# Patient Record
Sex: Female | Born: 1960 | Race: White | Hispanic: No | Marital: Married
Health system: Southern US, Community
[De-identification: ages and names within clinical notes are randomized; demographics above are authoritative.]

## PROBLEM LIST (undated history)

## (undated) DIAGNOSIS — Z86018 Personal history of other benign neoplasm: Secondary | ICD-10-CM

## (undated) HISTORY — DX: Personal history of other benign neoplasm: Z86.018

---

## 2004-04-01 ENCOUNTER — Ambulatory Visit: Payer: Self-pay | Admitting: Unknown Physician Specialty

## 2005-06-24 ENCOUNTER — Ambulatory Visit: Payer: Self-pay | Admitting: Unknown Physician Specialty

## 2005-08-02 ENCOUNTER — Emergency Department: Payer: Self-pay | Admitting: Emergency Medicine

## 2006-07-07 ENCOUNTER — Ambulatory Visit: Payer: Self-pay | Admitting: Unknown Physician Specialty

## 2006-08-12 IMAGING — CR DG FOOT COMPLETE 3+V*L*
1 series · 3 of 3 positions shown · non-contrast
Comparison: none

REASON FOR EXAM: TWISTED LEFT FOOT YESTERDAY
COMMENTS:  LMP: Three weeks ago

[Series 1: view not recorded · 0.17mm/px · 3 of 3 slices shown]
[im 1/3]
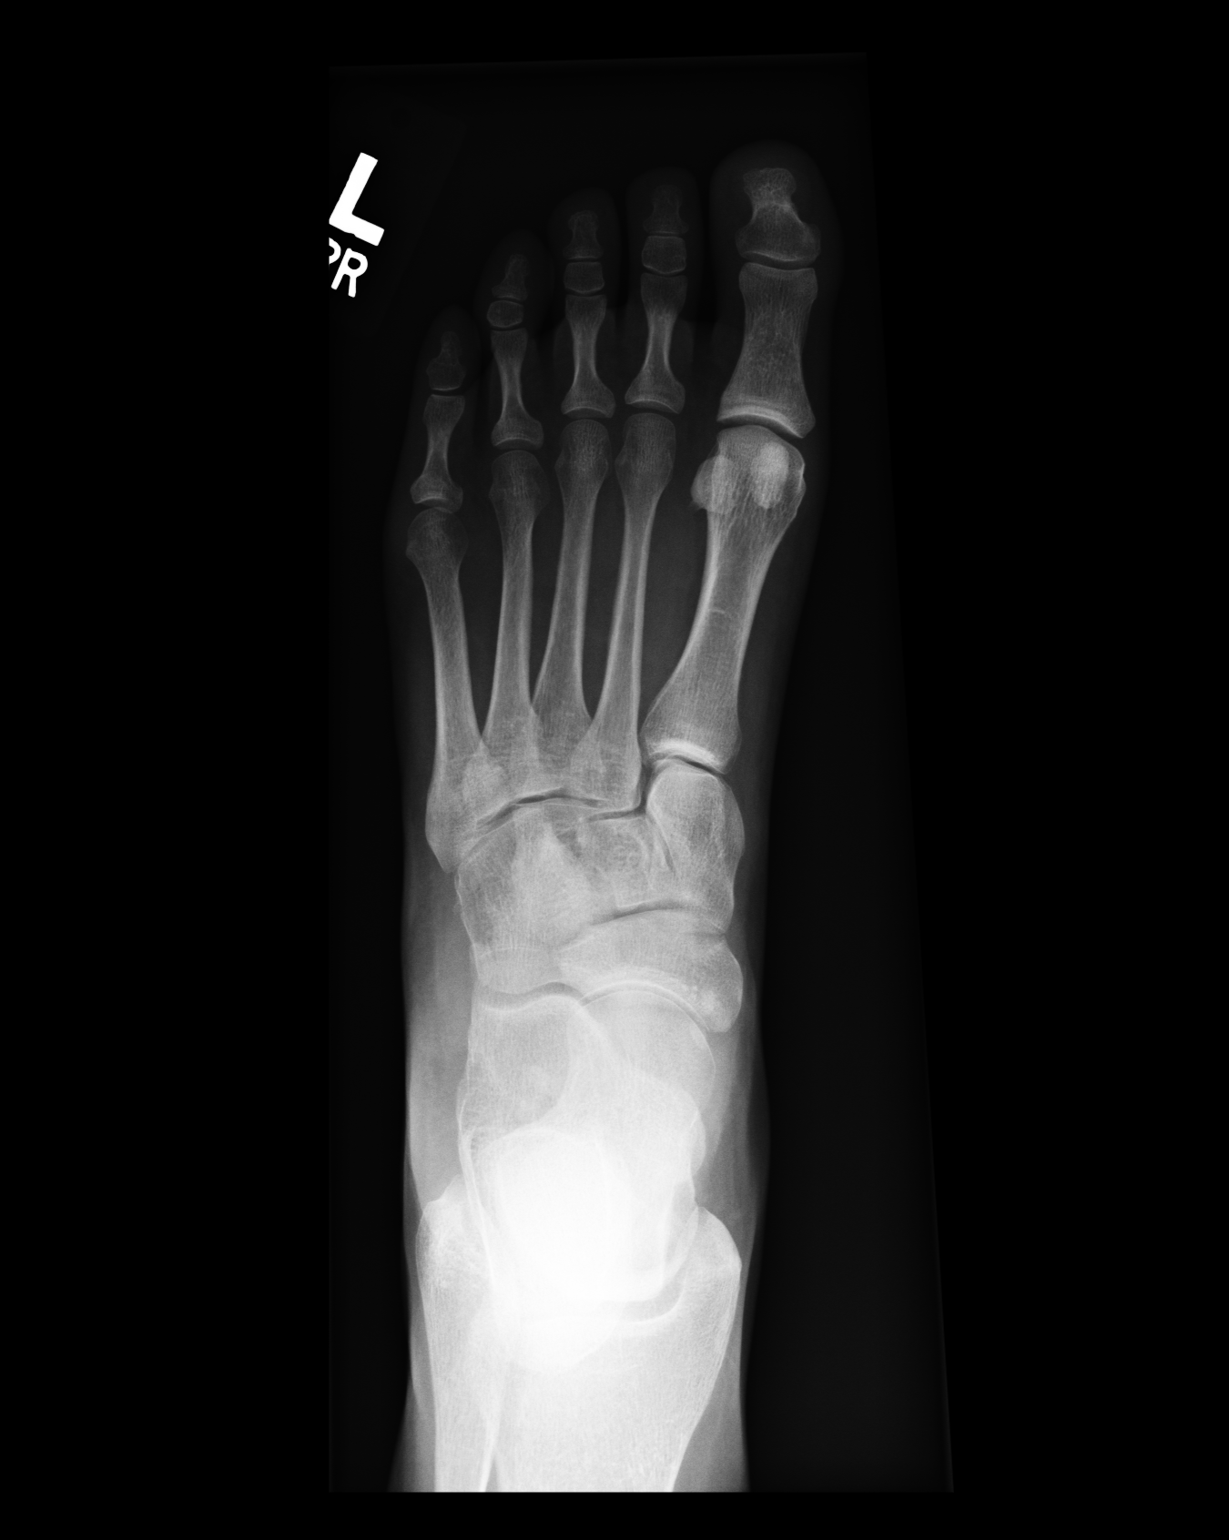
[im 2/3]
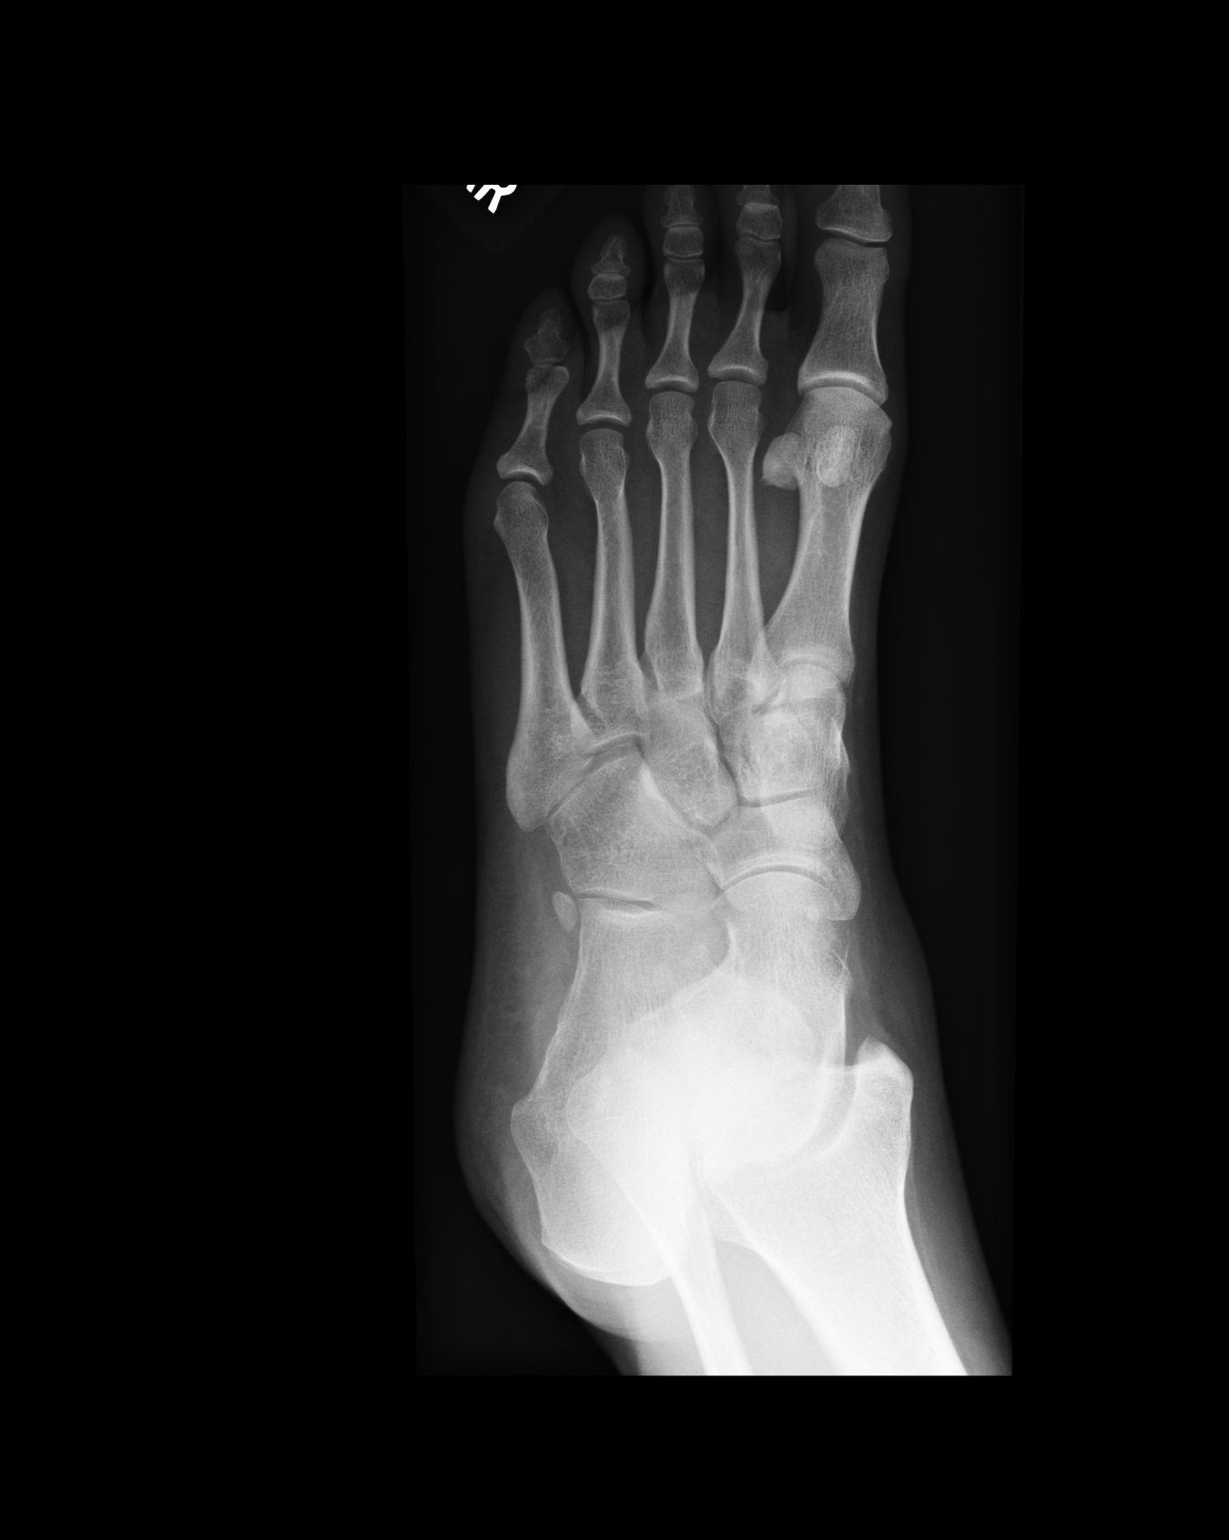
[im 3/3]
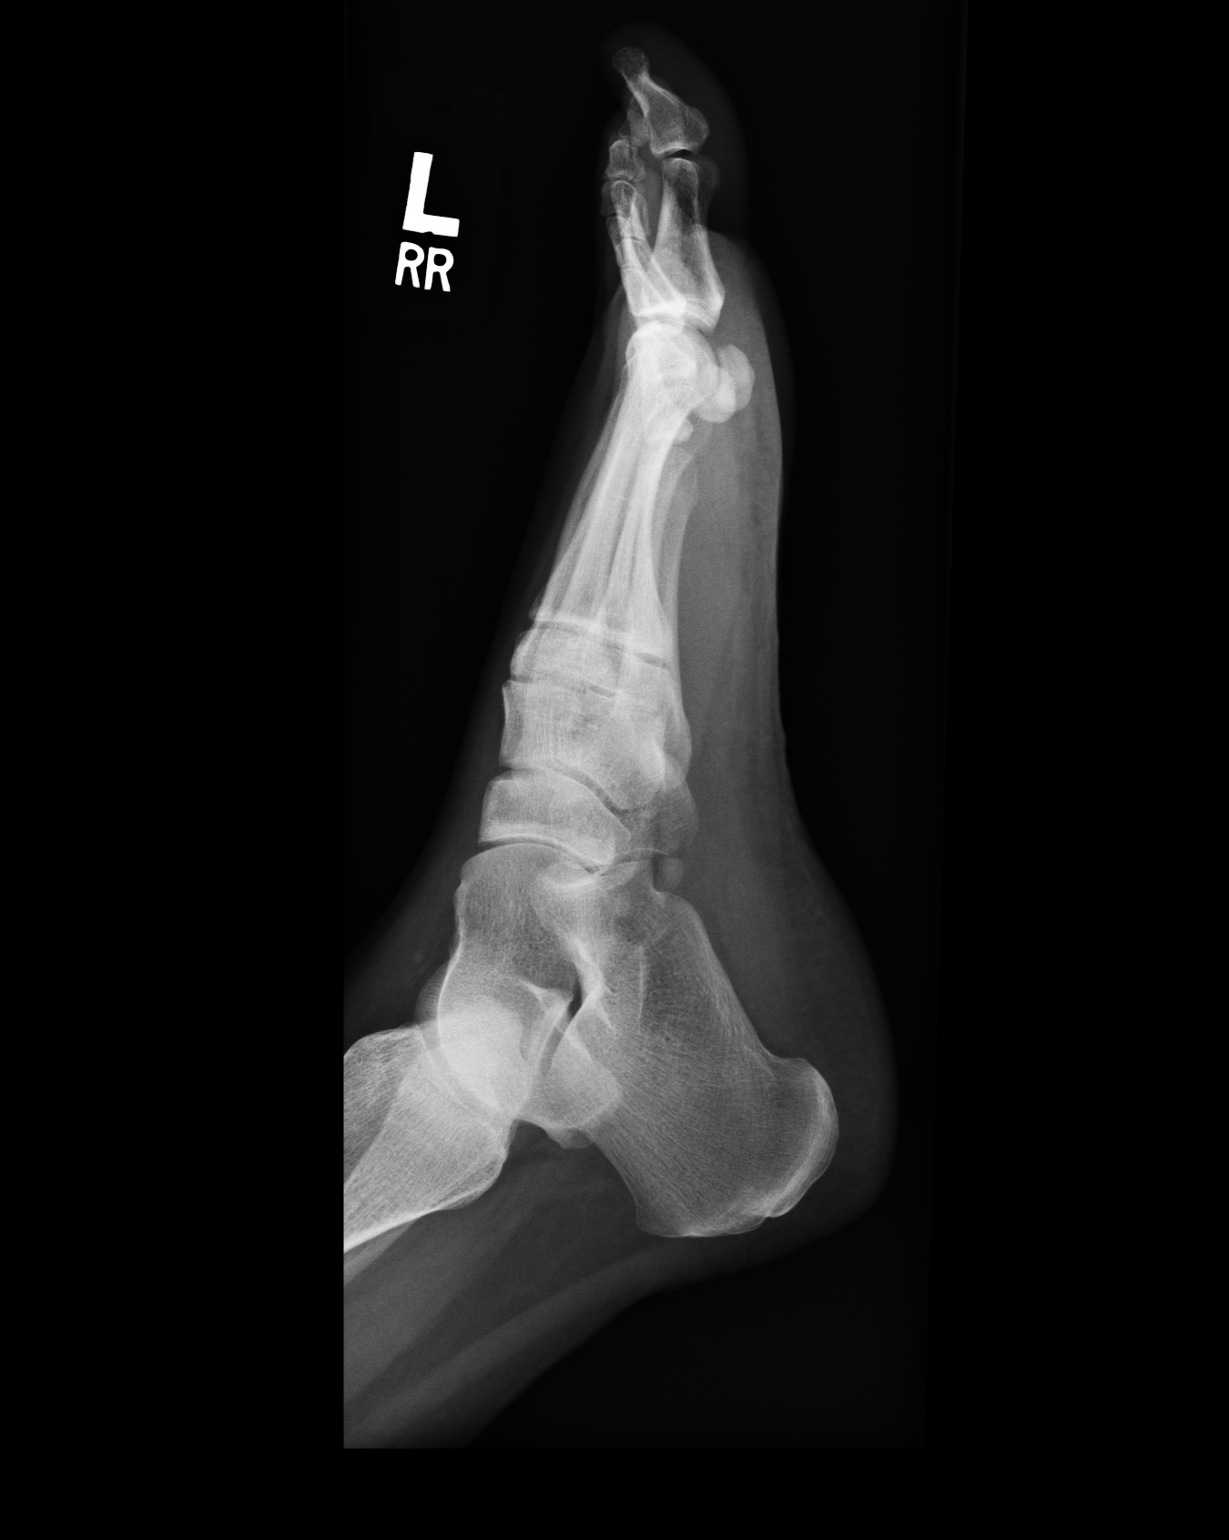

[3 of 3 positions shown; findings below may reference images not displayed]

PROCEDURE:     DXR - DXR FOOT LT COMP W/OBLIQUES  - August 02, 2005  [DATE]

RESULT:        There does not appear to be evidence of an overt fracture.
There does appear to be edema in the subcutaneous tissues in the region of
the cuboid.   A radio-occult fracture within this area cannot be excluded
and if clinically warranted, further evaluation with CT or MRI can be
obtained.  No further evidence of fracture, dislocation or malalignment is
appreciated.  Immobilization is recommended if clinically warranted.
IMPRESSION: No evidence of an overt fracture though if there is
clinical concern, repeat evaluation in 7-10 days can be obtained if
clinically warranted or further evaluation with MRI or CT of the foot.

## 2007-08-04 ENCOUNTER — Ambulatory Visit: Payer: Self-pay | Admitting: Unknown Physician Specialty

## 2007-08-22 ENCOUNTER — Ambulatory Visit: Payer: Self-pay | Admitting: Unknown Physician Specialty

## 2007-08-25 ENCOUNTER — Ambulatory Visit: Payer: Self-pay | Admitting: Unknown Physician Specialty

## 2008-08-06 ENCOUNTER — Ambulatory Visit: Payer: Self-pay | Admitting: Unknown Physician Specialty

## 2009-06-25 DIAGNOSIS — Z86018 Personal history of other benign neoplasm: Secondary | ICD-10-CM

## 2009-06-25 HISTORY — DX: Personal history of other benign neoplasm: Z86.018

## 2009-08-13 ENCOUNTER — Ambulatory Visit: Payer: Self-pay | Admitting: Unknown Physician Specialty

## 2010-03-27 ENCOUNTER — Ambulatory Visit: Payer: Self-pay | Admitting: Family Medicine

## 2010-06-17 ENCOUNTER — Emergency Department: Payer: Self-pay | Admitting: Internal Medicine

## 2010-08-19 ENCOUNTER — Ambulatory Visit: Payer: Self-pay | Admitting: Unknown Physician Specialty

## 2011-08-03 ENCOUNTER — Ambulatory Visit: Payer: Self-pay | Admitting: Gastroenterology

## 2011-08-20 ENCOUNTER — Ambulatory Visit: Payer: Self-pay | Admitting: Family Medicine

## 2011-08-31 ENCOUNTER — Ambulatory Visit: Payer: Self-pay | Admitting: Family Medicine

## 2012-06-27 ENCOUNTER — Emergency Department: Payer: Self-pay | Admitting: Emergency Medicine

## 2012-06-27 LAB — CBC
HCT: 39.5 % (ref 35.0–47.0)
HGB: 13.8 g/dL (ref 12.0–16.0)
MCHC: 35 g/dL (ref 32.0–36.0)
MCV: 87 fL (ref 80–100)

## 2012-06-27 LAB — COMPREHENSIVE METABOLIC PANEL
Alkaline Phosphatase: 82 U/L (ref 50–136)
Calcium, Total: 9.8 mg/dL (ref 8.5–10.1)
Chloride: 103 mmol/L (ref 98–107)
Co2: 30 mmol/L (ref 21–32)
Glucose: 109 mg/dL — ABNORMAL HIGH (ref 65–99)
Osmolality: 277 (ref 275–301)
Potassium: 3.8 mmol/L (ref 3.5–5.1)
SGPT (ALT): 18 U/L (ref 12–78)
Sodium: 138 mmol/L (ref 136–145)
Total Protein: 7.3 g/dL (ref 6.4–8.2)

## 2012-06-27 LAB — URINALYSIS, COMPLETE
Bilirubin,UR: NEGATIVE
Glucose,UR: NEGATIVE mg/dL (ref 0–75)
Ketone: NEGATIVE
Nitrite: NEGATIVE
Specific Gravity: 1.024 (ref 1.003–1.030)
Squamous Epithelial: <1

## 2012-07-07 ENCOUNTER — Ambulatory Visit: Payer: Self-pay | Admitting: Urology

## 2013-07-07 IMAGING — CT CT STONE STUDY
1 of 2 series · 15 of 32 positions shown, 19 images · non-contrast
Comparison: none

REASON FOR EXAM: hematuria  and abd pain     waiting room
COMMENTS:   LMP: Post-Menopausal

[Series 2: 3mm soft tissue · axial · 0.66mm/px · z∈[-550,-154]mm · 15 of 144 slices shown, 19 images]
[im 6/144  soft-tissue]
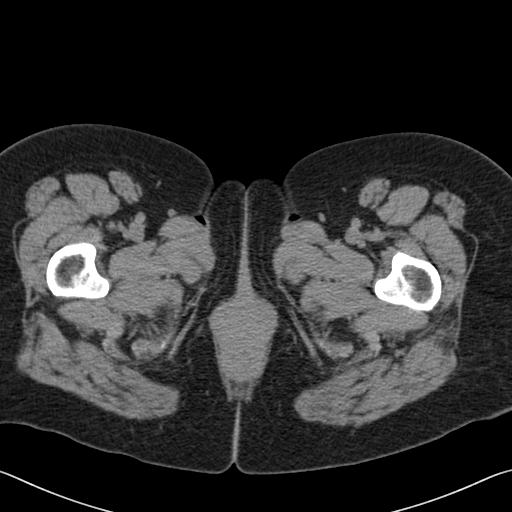
[im 6/144  bone]
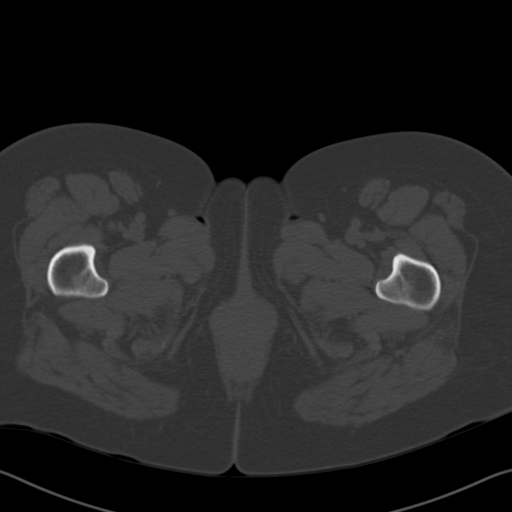
[im 18/144  soft-tissue]
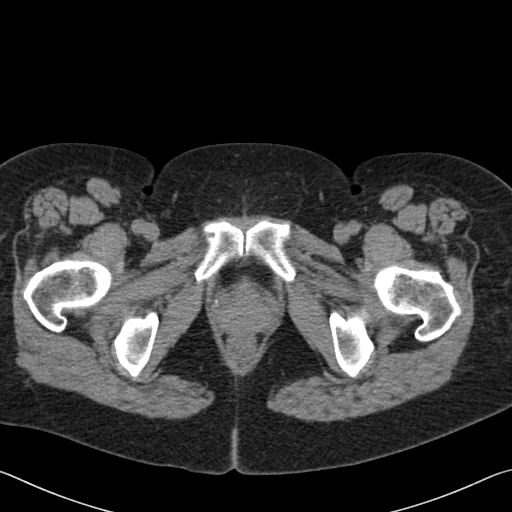
[im 30/144  soft-tissue]
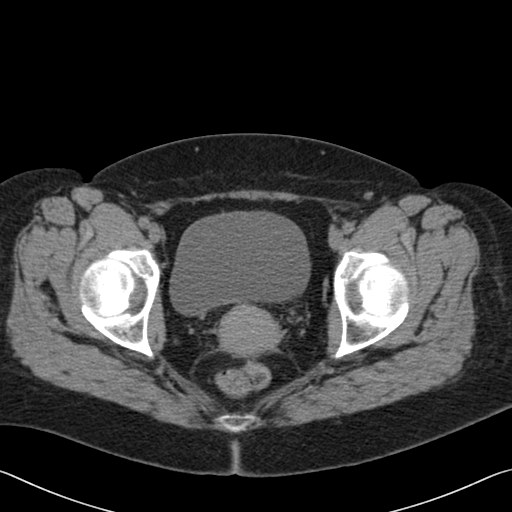
[im 42/144  soft-tissue]
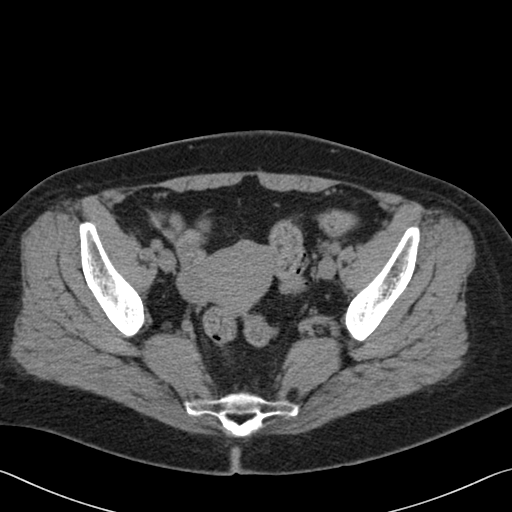
[im 48/144  soft-tissue]
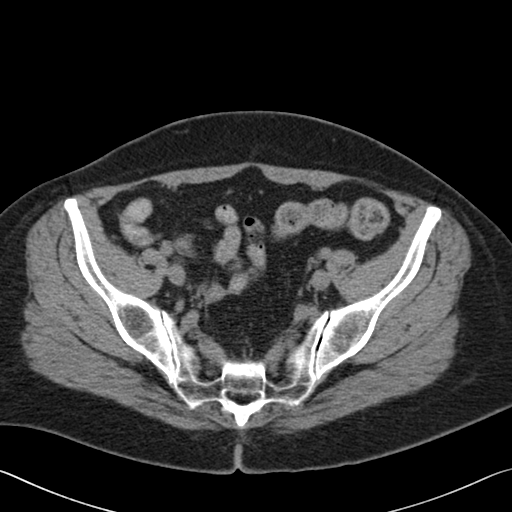
[im 60/144  soft-tissue]
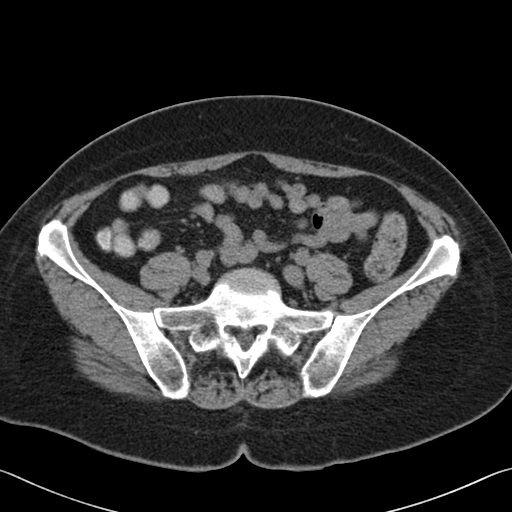
[im 72/144  soft-tissue]
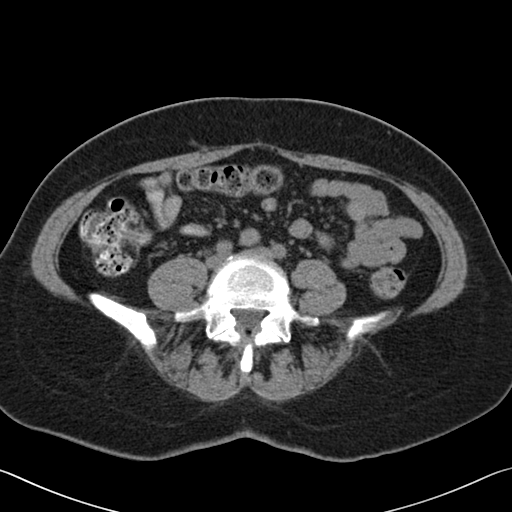
[im 84/144  soft-tissue]
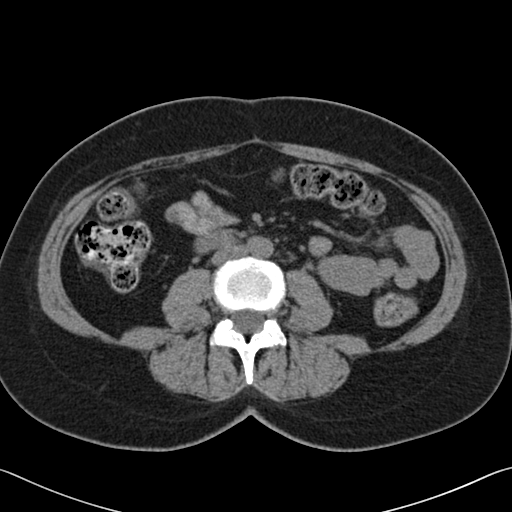
[im 96/144  soft-tissue]
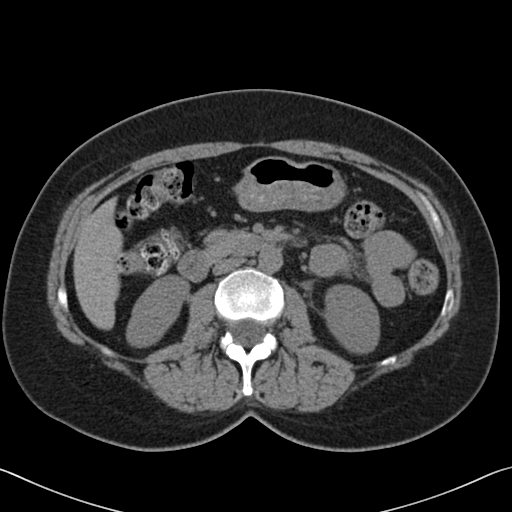
[im 96/144  bone]
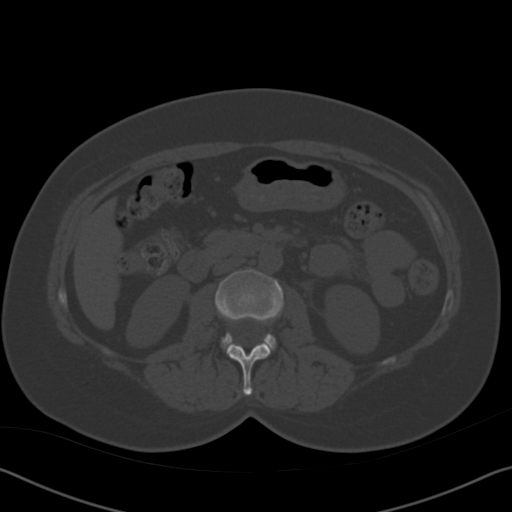
[im 102/144  soft-tissue]
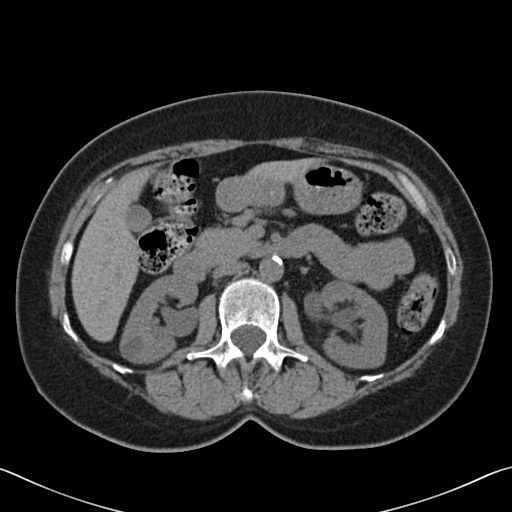
[im 114/144  soft-tissue]
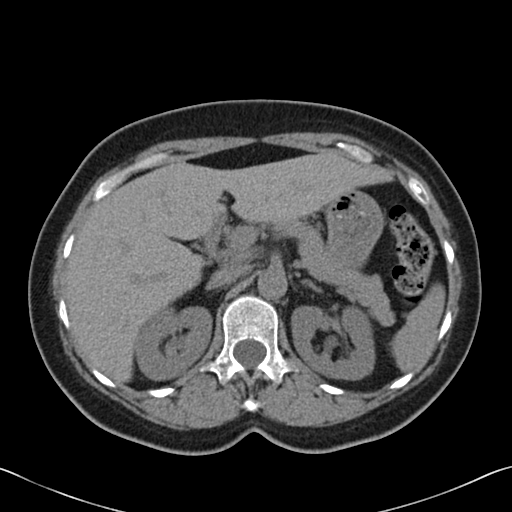
[im 120/144  lung]
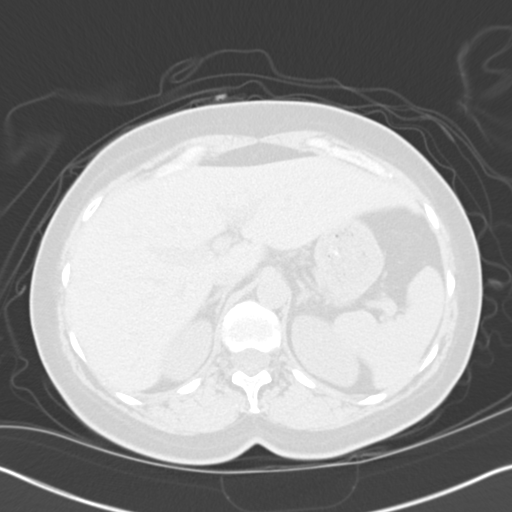
[im 126/144  soft-tissue]
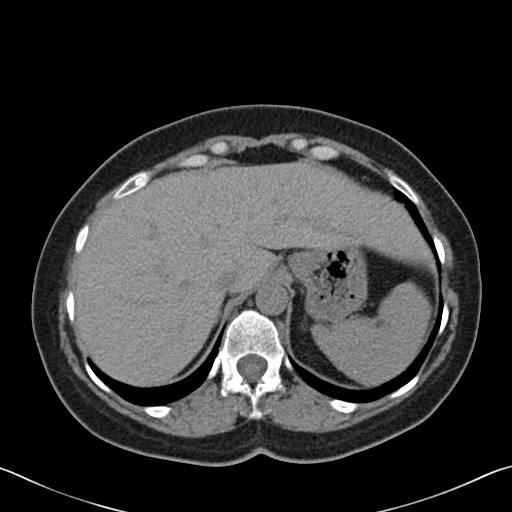
[im 126/144  lung]
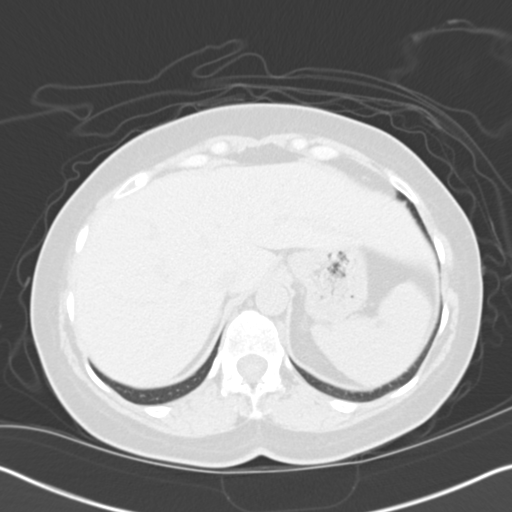
[im 132/144  lung]
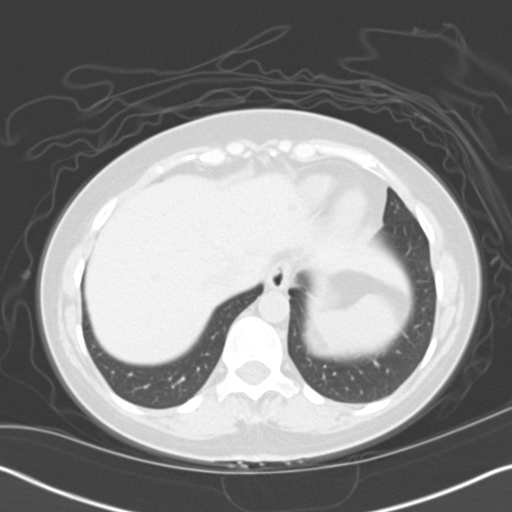
[im 138/144  soft-tissue]
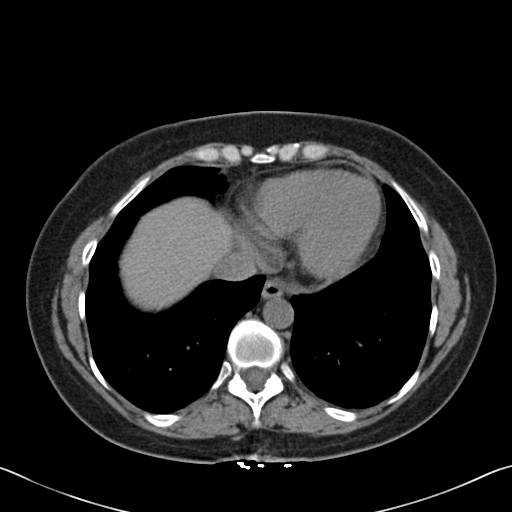
[im 138/144  lung]
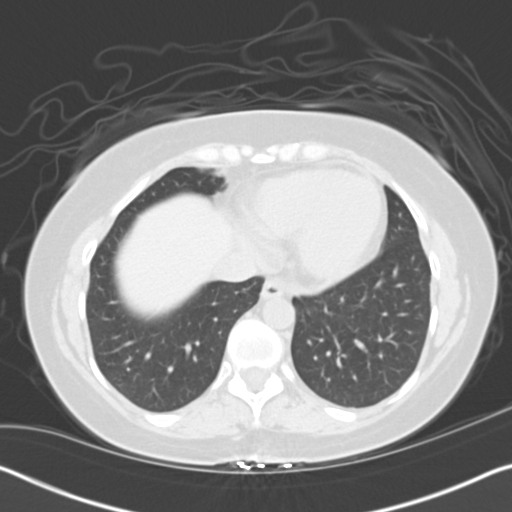

[15 of 32 positions shown; findings below may reference images not displayed]

PROCEDURE:     CT  - CT ABDOMEN /PELVIS WO (STONE)  - June 27, 2012  [DATE]

RESULT:     Renal stone protocol noncontrast CT of the abdomen and pelvis
demonstrates a left ureteropelvic junction stone with a superior to inferior
dimension of approximately 6.4 mm with minimal left hydronephrosis. No
distal ureteral calculi are evident. No bladder calculi are evident. No
additional renal calculi are demonstrated. Extrarenal pelvis is noted
bilaterally. Noncontrast images of the liver, spleen, pancreas, adrenal
glands, gallbladder and abdominal aorta are unremarkable. There is no
evidence of abnormal bowel distention or wall thickening. The appendix is
not identified and may have been removed. Correlate with history. There is
no ascites or abscess. The bony structures appear unremarkable. Images
through the lung bases show grossly normal aeration effusion or pneumothorax.
IMPRESSION: 1. Left ureteropelvic junction stone with minimal left hydronephrosis.

[REDACTED]

## 2016-02-11 ENCOUNTER — Encounter: Payer: Self-pay | Admitting: Family Medicine

## 2020-06-03 ENCOUNTER — Other Ambulatory Visit: Payer: Self-pay

## 2020-06-03 ENCOUNTER — Ambulatory Visit: Payer: Managed Care, Other (non HMO) | Admitting: Dermatology

## 2020-06-03 DIAGNOSIS — M792 Neuralgia and neuritis, unspecified: Secondary | ICD-10-CM | POA: Diagnosis not present

## 2020-06-03 DIAGNOSIS — B027 Disseminated zoster: Secondary | ICD-10-CM

## 2020-06-03 DIAGNOSIS — B029 Zoster without complications: Secondary | ICD-10-CM

## 2020-06-03 MED ORDER — GABAPENTIN (ONCE-DAILY) 300 MG PO TABS: 300.0000 mg | ORAL_TABLET | Freq: Two times a day (BID) | ORAL | 1 refills | Status: DC

## 2020-06-03 MED ORDER — VALACYCLOVIR HCL 1 G PO TABS: 1000.0000 mg | ORAL_TABLET | Freq: Three times a day (TID) | ORAL | 0 refills | Status: AC

## 2020-06-03 NOTE — Patient Instructions (Addendum)
Recommend Voltaren gel topically.   Also recommend OTC Gold Bond Rapid Relief Anti-Itch cream (pramoxine + menthol) up to 3 times per day to areas that are itchy.  If you have any questions or concerns for your doctor, please call our main line at [phone] and press option 4 to reach your doctor's medical assistant. If no one answers, please leave a voicemail as directed and we will return your call as soon as possible. Messages left after 4 pm will be answered the following business day.   You may also send [place] a message via MyChart. We typically respond to MyChart messages within 1-2 business days.  For prescription refills, please ask your pharmacy to contact our office. Our fax number is [phone].  If you have an urgent issue when the clinic is closed that cannot wait until the next business day, you can page your doctor at the number below.    Please note that while we do our best to be available for urgent issues outside of office hours, we are not available 24/7.   If you have an urgent issue and are unable to reach [place], you may choose to seek medical care at your doctor's office, retail clinic, urgent care center, or emergency room.  If you have a medical emergency, please immediately call 911 or go to the emergency department.  Pager Numbers  - Dr. Gwen Pounds: [phone]  - Dr. Neale Burly: [phone]  - Dr. Roseanne Reno: [phone]  In the event of inclement weather, please call our main line at [phone] for an update on the status of any delays or closures.  Dermatology Medication Tips: Please keep the boxes that topical medications come in in order to help keep track of the instructions about where and how to use these. Pharmacies typically print the medication instructions only on the boxes and not directly on the medication tubes.   If your medication is too expensive, please contact our office at [phone] option 4 or send [place] a message through MyChart.   We are  unable to tell what your co-pay for medications will be in advance as this is different depending on your insurance coverage. However, we may be able to find a substitute medication at lower cost or fill out paperwork to get insurance to cover a needed medication.   If a prior authorization is required to get your medication covered by your insurance company, please allow [place] 1-2 business days to complete this process.  Drug prices often vary depending on where the prescription is filled and some pharmacies may offer cheaper prices.  The website www.goodrx.com contains coupons for medications through different pharmacies. The prices here do not account for what the cost may be with help from insurance (it may be cheaper with your insurance), but the website can give you the price if you did not use any insurance.  - You can print the associated coupon and take it with your prescription to the pharmacy.  - You may also stop by our office during regular business hours and pick up a GoodRx coupon card.  - If you need your prescription sent electronically to a different pharmacy, notify our office through [facility] or by phone at [phone] option 4.   Gabapentin - Take 1 tablet at night. After 4 days, if still with pain, increase to twice a day. Do not stop medicine abruptly. Decrease in 1 month to 1/2 tablet nightly before stopping. May cause drowsiness.

## 2020-06-03 NOTE — Progress Notes (Signed)
   New Patient Visit  Subjective  Erica Hammond is a 60 y.o. female who presents for the following: Rash (New patient here today with a rash at right chest, right arm and right shoulder. Last Monday patient had the sensation in the affected area of bug bites but there was no rash visible. Rash did come up 2 days later. She has used Benadryl ointment. It is painful for patient. ).   The following portions of the chart were reviewed this encounter and updated as appropriate:   Allergies  Meds  Problems  Med Hx  Surg Hx  Fam Hx      Review of Systems:  No other skin or systemic complaints except as noted in HPI or Assessment and Plan.  Objective  Well appearing patient in no apparent distress; mood and affect are within normal limits.  A focused examination was performed including face, neck, chest and back and right arm. Relevant physical exam findings are noted in the Assessment and Plan.  Objective  Right chest: Clustered erythematous vesicles on erythematous base in a dermatomal distribution   Assessment & Plan  Neuropathic pain Right Breast  Secondary to Zoster  Start gabapentin as directed. Cautioned re sedation.  Other Related Medications Gabapentin, Once-Daily, 300 MG TABS  Herpes zoster without complication Right chest  Start Valtrex 1000mg  TID x 7 days.  Start gabapentin 300mg . Take 1 tablet at night. After 4 days, if still with pain, increase to twice a day. Do not stop medicine abruptly. After 1 month decrease to 1/2 tablet nightly before stopping. May cause drowsiness.   Ordered Medications: valACYclovir (VALTREX) 1000 MG tablet  Return if symptoms worsen or fail to improve.  , RMA, am acting as scribe for , MD .  Documentation: I have reviewed the above documentation for accuracy and completeness, and I agree with the above.  Anise Salvo, MD

## 2020-06-04 ENCOUNTER — Telehealth: Payer: Self-pay

## 2020-06-04 DIAGNOSIS — B027 Disseminated zoster: Secondary | ICD-10-CM

## 2020-06-04 DIAGNOSIS — M792 Neuralgia and neuritis, unspecified: Secondary | ICD-10-CM

## 2020-06-04 MED ORDER — GABAPENTIN (ONCE-DAILY) 300 MG PO TABS: 300.0000 mg | ORAL_TABLET | Freq: Two times a day (BID) | ORAL | 1 refills | Status: DC

## 2020-06-04 NOTE — Telephone Encounter (Signed)
Updated diagnosis to neuropathic pain. Can you resubmit? Thank you

## 2020-06-04 NOTE — Telephone Encounter (Signed)
[facility] denied Gabapentin RX. They state the diagnosis for patient is not FDA approved for medication.

## 2020-06-04 NOTE — Telephone Encounter (Signed)
Pt called to have her Gabapentin sent to [facility] pharmacy on [address] erx'd Gabapentin to [employer]

## 2020-06-05 ENCOUNTER — Telehealth: Payer: Self-pay

## 2020-06-05 ENCOUNTER — Other Ambulatory Visit: Payer: Self-pay

## 2020-06-05 MED ORDER — GABAPENTIN 300 MG PO CAPS: 300.0000 mg | ORAL_CAPSULE | ORAL | 1 refills | Status: DC

## 2020-06-05 NOTE — Telephone Encounter (Signed)
I also have never had trouble getting this medication. There is something strange with her insurance that they are denying this. It's an inexpensive medication typically used for this condition.  Unfortunately there are no other good medication options for this.   However, she should be able to get the capsule version (I believe we sent in tablets so need to switch) for cash price $5.89 at the [place] pharmacy, $6.72 at [place], or $11.89 at [facility] pharmacy with the GoodRx coupon card according to their website.

## 2020-06-05 NOTE — Telephone Encounter (Signed)
RX sent in and patient advised of information per Dr. Neale Burly. Patient sent GoodRX coupon.

## 2020-06-05 NOTE — Progress Notes (Signed)
RX sent in per telephone call note with Dr. Neale Burly. aw

## 2020-06-05 NOTE — Telephone Encounter (Signed)
Patient called this morning requesting different pain medication. Patient states she was still unable to get through [facility] as the RX needs a PA. Explained to patient I can do the PA this morning no problem but unsure how long it will take for her insurance to answer. Patient does not want to wait for PA. She is requesting you prescribe something else. Patient made the comment "She has never had this much trouble getting any other types of pain medication in the past."

## 2020-06-10 ENCOUNTER — Encounter: Payer: Self-pay | Admitting: Dermatology

## 2020-06-11 NOTE — Addendum Note (Signed)
Addended by: Sandi Mealy on: 06/11/2020 08:31 PM   Modules accepted: Level of Service

## 2022-08-31 NOTE — Progress Notes (Addendum)
 [facility] OSTEOPOROSIS CONSULT NOTE  PCP: Provider No address on file  Thank you for this consult request. Please see my assessment and recommendations below.   Chief Complaint  Patient presents with  . Establish Care    HISTORY OF PRESENT ILLNESS:  Erica Hammond is a 62 y.o. female  who presents for consultation at the recommendation of Vannie JINNY Capra, MD, for evaluation and management of OSTEOPOROSIS with/without history of fragility fracture.  She is transferring care from Dr. Capra as he retires.  Last appointment with him was 06/22/2022.  She also has post-ablative hypothyroidism treated with Armour Thyroid . She had hyperthyroidism after a pregnancy in 1992 and had RAI ablation a year later. Last TSH was 0.455 and free T4 was 1.05. This was from 03/2022.She exercises daily with rowing machine.   Initial diagnosis - 2014 or so based on DEXA (no old records available) Fracture history - foot Treatment history - Reclast 3-4 doses; Prolia  since 2021, last dose in April 2024 Dental - healthy, no loss  Last DEXA was performed on 12/24/2021 at an outside clinic and showed T-score of -1.4 in lumbar spine, -1.7 in left femoral neck, and -1.8 in total hip. There is prior scan from 2021 for comparison.  Since the last scan there has been 4% increase in total hip and 6.5% increase in femoral neck BMD.  FRAX: not done  Predisposing factors: FH osteoporosis/hip fracture  none Steroid/prednisone use  none Smoking   Quit 1984 Alcohol use  Wine nightly Menopause/ERT 62 yo; no ERT Rheumatoid arthritis no Other chronic illness  Hypothyroid, vit D deficiency; otherwise well Ca/Vit D  Yes; Ergocalciferol x 10 yrs   ROS: anxiety and hot flashes, weight control   No past medical history on file. Past Surgical History:  Procedure Laterality Date  . LAPAROSCOPIC TUBAL LIGATION  1993  . APPENDECTOMY     1967   Family History  Problem Relation Name Age of Onset  .  Thyroid  disease Mother     Social History   Socioeconomic History  . Marital status: Single  Tobacco Use  . Smoking status: Former    Types: Cigarettes    Start date: 1984  . Smokeless tobacco: Never  Substance and Sexual Activity  . Alcohol use: Yes  . Drug use: Not Currently   Current Outpatient Medications on File Prior to Visit  Medication Sig Dispense Refill  . ergocalciferol, vitamin D2, 1,250 mcg (50,000 unit) capsule Take 50,000 Units by mouth once a week    . PARoxetine  (PAXIL ) 10 MG tablet Take 10 mg by mouth once daily    . thyroid  (ARMOUR THYROID ) 90 mg tablet Take 90 mg by mouth once daily     No current facility-administered medications on file prior to visit.   Allergies  Allergen Reactions  . Meloxicam Swelling  . Penicillin Rash  . Sulfa (Sulfonamide Antibiotics) Unknown     OBJECTIVE:   Vitals:   09/02/22 0828  BP: 110/82  Pulse: 58  Weight: 82.1 kg (181 lb)  Height: 165.7 cm (5' 5.25)  PainSc: 0-No pain    Body mass index is 29.89 kg/m.  Wt Readings from Last 3 Encounters:  09/02/22 82.1 kg (181 lb)     Gen: NAD, pleasant  HEENT: [place]/AT Neck: No adenopathy. Thyroid  is not palpable CV: RRR without murmur Resp: Unlabored, CTAB Skin: Exposed areas without rashes or wounds Neuro: Alert, oriented; no obvious tremor. Able to arise from sitting without assistance. Extremities: No edema  Spine: No kyphosis or scoliosis  LABORATORY: From 04/01/2022 - TSH 0.455, free T4 1.05, free T36.9 No other available labs  ASSESSMENT AND RECOMMENDATIONS:    ICD-10-CM  1. Age-related osteoporosis without current pathological fracture  M81.0  2. Postablative hypothyroidism  E89.0  3. Vitamin D deficiency  E55.9    COMMENT: As we discussed, her last DEXA showed significant improvement in BMD with T-scores out of the osteoporosis range.  She has no ongoing health problems that might accelerate bone loss and no fracture history.  I think she can consider  discontinuing therapy with Prolia , but first I would give her a single dose of Reclast to cement in her BMD gains. - discontinue Prolia .  - start Reclast, first dose in October -From a thyroid  standpoint she is doing fine.  She is clinically euthyroid on Armour Thyroid . -She will send me a copy of her most recent labs. -She will have outside labs as below..  She was given printed orders.  She works for [employer], so she can have them done there at no cost. -Regarding vitamin D deficiency, she is on longstanding ergocalciferol.  As we discussed, there is a potential to overdose on this so vitamin D level and calcium need to be followed. -She has good bone health habits. -We discussed recommendations for calcium and vitamin D intake and physical activity.  Requested Prescriptions    No prescriptions requested or ordered in this encounter    Orders Placed This Encounter  Procedures  . Thyroid  Stimulating-Hormone (TSH)  . Basic Metabolic Panel (BMP)  . Vitamin D, 25-Hydroxy - [employer]    Patient Instructions  We will get outside labs: TSH, BMP, Vit D Sed me you prior lab reports from Feb 2024. Calcium: 600 mg/d form food and 600 mg/d from supplement. Check what you're taking. We will arrange for Reclast in October.   Return in about 1 year (around 09/02/2023) for osteoporosis, thyroid .  Dorothyann Corin, MD [facility] Phone: [phone]     .

## 2023-06-17 DIAGNOSIS — Z0000 Encounter for general adult medical examination without abnormal findings: Secondary | ICD-10-CM | POA: Diagnosis not present

## 2023-06-17 DIAGNOSIS — E039 Hypothyroidism, unspecified: Secondary | ICD-10-CM | POA: Diagnosis not present

## 2023-06-17 DIAGNOSIS — R739 Hyperglycemia, unspecified: Secondary | ICD-10-CM | POA: Diagnosis not present

## 2023-06-17 DIAGNOSIS — M810 Age-related osteoporosis without current pathological fracture: Secondary | ICD-10-CM | POA: Diagnosis not present

## 2023-06-24 DIAGNOSIS — R7303 Prediabetes: Secondary | ICD-10-CM | POA: Diagnosis not present

## 2023-06-24 DIAGNOSIS — E039 Hypothyroidism, unspecified: Secondary | ICD-10-CM | POA: Diagnosis not present

## 2023-06-24 DIAGNOSIS — K2100 Gastro-esophageal reflux disease with esophagitis, without bleeding: Secondary | ICD-10-CM | POA: Diagnosis not present

## 2023-06-24 DIAGNOSIS — M810 Age-related osteoporosis without current pathological fracture: Secondary | ICD-10-CM | POA: Diagnosis not present

## 2023-09-29 ENCOUNTER — Ambulatory Visit: Payer: Self-pay | Admitting: Nurse Practitioner

## 2023-09-29 ENCOUNTER — Encounter: Payer: Self-pay | Admitting: Nurse Practitioner

## 2023-09-29 VITALS — BP 125/70 | HR 80 | Temp 97.2°F | Resp 16 | Ht 65.0 in | Wt 179.4 lb

## 2023-09-29 DIAGNOSIS — E2839 Other primary ovarian failure: Secondary | ICD-10-CM | POA: Diagnosis not present

## 2023-09-29 DIAGNOSIS — N951 Menopausal and female climacteric states: Secondary | ICD-10-CM | POA: Diagnosis not present

## 2023-09-29 DIAGNOSIS — M8589 Other specified disorders of bone density and structure, multiple sites: Secondary | ICD-10-CM | POA: Insufficient documentation

## 2023-09-29 DIAGNOSIS — E039 Hypothyroidism, unspecified: Secondary | ICD-10-CM | POA: Diagnosis not present

## 2023-09-29 DIAGNOSIS — Z1231 Encounter for screening mammogram for malignant neoplasm of breast: Secondary | ICD-10-CM

## 2023-09-29 DIAGNOSIS — Z1212 Encounter for screening for malignant neoplasm of rectum: Secondary | ICD-10-CM

## 2023-09-29 DIAGNOSIS — Z1211 Encounter for screening for malignant neoplasm of colon: Secondary | ICD-10-CM

## 2023-09-29 MED ORDER — THYROID 90 MG PO TABS: 90.0000 mg | ORAL_TABLET | Freq: Every day | ORAL | 3 refills | Status: AC

## 2023-09-29 MED ORDER — PAROXETINE HCL 10 MG PO TABS: 10.0000 mg | ORAL_TABLET | Freq: Every day | ORAL | 3 refills | Status: AC

## 2023-09-29 NOTE — Progress Notes (Signed)
 [facility] [address] [place], [place] [address]  Internal MEDICINE  Office Visit Note  Patient Name: Erica Hammond  [id number]  [id number]  Date of Service: 09/29/2023   Complaints/HPI Pt is here for establishment of PCP. Chief Complaint  Patient presents with   New Patient (Initial Visit)    Thyroid , bone density. Est care.     HPI Erica Hammond presents for a new patient visit to establish care.  Well-appearing 63 y.o. female with acquired hypothyroidism s/p radioactive iodine treatment for hyperthyroidism, osteopenia and vasomotor symptoms of menopause.  Work: works at [employer] Home: live at home alone with 3 cats.  Diet: fair  Exercise: reports not enough exercise, works remotely from home  Tobacco use: none  Alcohol use: has 1 glass of wine occasionally.  Illicit drug use: none  Routine CRC screening: opt for cologuard  Routine mammogram: due now  DEXA scan: due for repeat dexa scan  Pap smear: discontinued per patient preference.  Eye exam and/or foot exam: Labs: due for labs New or worsening pain: none  Patient was hyperthyroid after having her son, and was given radioactive iodine to treat, and is now taking levothyroxine long term for acquired hypothyroidism.  Had shingles in 2022. Has not had the vaccine yet.  Current Medication: Outpatient Encounter Medications as of 09/29/2023  Medication Sig   thyroid  (ARMOUR THYROID ) 90 MG tablet Take 1 tablet (90 mg total) by mouth daily.   PARoxetine  (PAXIL ) 10 MG tablet Take 1 tablet (10 mg total) by mouth daily.   Vitamin D, Ergocalciferol, (DRISDOL) 1.25 MG (50000 UNIT) CAPS capsule Take by mouth.   [DISCONTINUED] ARMOUR THYROID  60 MG tablet Take 60 mg by mouth daily.   [DISCONTINUED] gabapentin  (NEURONTIN ) 300 MG capsule Take 1 capsule (300 mg total) by mouth as directed. Take 1 tablet at night. After 4 days, if you still have pain, increase to twice a day. Do not stop RX abruptly.   [DISCONTINUED] Gabapentin ,  Once-Daily, 300 MG TABS Take 300 mg by mouth in the morning and at bedtime. Take 1 tablet at night. After 4 days, if still with pain, increase to twice a day. Do not stop medicine abruptly.   [DISCONTINUED] PARoxetine  (PAXIL ) 10 MG tablet Take 10 mg by mouth daily.   [DISCONTINUED] PROLIA  60 MG/ML SOSY injection Inject into the skin.   No facility-administered encounter medications on file as of 09/29/2023.    Surgical History: History reviewed. No pertinent surgical history.  Medical History: Past Medical History:  Diagnosis Date   Hx of dysplastic nevus 06/25/2009   multiple sites     Family History: History reviewed. No pertinent family history.  Social History   Socioeconomic History   Marital status: Married    Spouse name: Not on file   Number of children: Not on file   Years of education: Not on file   Highest education level: Not on file  Occupational History   Not on file  Tobacco Use   Smoking status: Never   Smokeless tobacco: Never  Substance and Sexual Activity   Alcohol use: Yes    Comment: rare   Drug use: Never   Sexual activity: Not Currently  Other Topics Concern   Not on file  Social History Narrative   Not on file   Social Drivers of Health   Financial Resource Strain: Not on file  Food Insecurity: Not on file  Transportation Needs: Not on file  Physical Activity: Not on file  Stress: Not  on file  Social Connections: Not on file  Intimate Partner Violence: Not on file     Review of Systems  Constitutional:  Positive for fatigue. Negative for chills and unexpected weight change.  HENT:  Negative for congestion, postnasal drip, rhinorrhea, sneezing and sore throat.   Eyes:  Negative for redness.  Respiratory: Negative.  Negative for cough, chest tightness, shortness of breath and wheezing.   Cardiovascular: Negative.  Negative for chest pain and palpitations.  Gastrointestinal:  Negative for abdominal pain, constipation, diarrhea, nausea  and vomiting.  Genitourinary:  Negative for dysuria and frequency.  Musculoskeletal: Negative.  Negative for arthralgias, back pain, joint swelling and neck pain.  Skin:  Negative for rash.  Neurological: Negative.  Negative for tremors and numbness.  Hematological:  Negative for adenopathy. Does not bruise/bleed easily.  Psychiatric/Behavioral:  Negative for behavioral problems (Depression), sleep disturbance and suicidal ideas. The patient is not nervous/anxious.     Vital Signs: BP 125/70   Pulse 80   Temp (!) 97.2 F (36.2 C)   Resp 16   Ht 5' 5 (1.651 m)   Wt 179 lb 6.4 oz (81.4 kg)   SpO2 99%   BMI 29.85 kg/m    Physical Exam Vitals reviewed.  Constitutional:      General: She is not in acute distress.    Appearance: Normal appearance. She is not ill-appearing.  HENT:     Head: Normocephalic and atraumatic.  Eyes:     Pupils: Pupils are equal, round, and reactive to light.  Cardiovascular:     Rate and Rhythm: Normal rate and regular rhythm.  Pulmonary:     Effort: Pulmonary effort is normal. No respiratory distress.  Musculoskeletal:     Right lower leg: No edema.     Left lower leg: No edema.  Skin:    General: Skin is warm and dry.     Capillary Refill: Capillary refill takes less than 2 seconds.  Neurological:     Mental Status: She is alert and oriented to person, place, and time.  Psychiatric:        Mood and Affect: Mood normal.        Behavior: Behavior normal.       Assessment/Plan: 1. Acquired hypothyroidism (Primary) Continue armour thyroid  as prescribed.  - thyroid  (ARMOUR THYROID ) 90 MG tablet; Take 1 tablet (90 mg total) by mouth daily.  Dispense: 90 tablet; Refill: 3 - T3 - TSH + free T4 - T3 Uptake  2. Osteopenia of multiple sites Dexa scan ordered - DG Bone Density; Future  3. 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 Vasomotor symptoms due to menopause Continue paroxetine  as  prescribed.  - PARoxetine  (PAXIL ) 10 MG tablet; Take 1 tablet (10 mg total) by mouth daily.  Dispense: 90 tablet; Refill: 3  5. Screening for colorectal cancer Cologuard test ordered  - Cologuard  6. Encounter for screening mammogram for malignant neoplasm of breast Routine mammogram ordered  - MM 3D SCREENING MAMMOGRAM BILATERAL BREAST; Future    General Counseling: Jamilah verbalizes understanding of the findings of todays visit and agrees with plan of treatment. I have discussed any further diagnostic evaluation that may be needed or ordered today. We also reviewed her medications today. she has been encouraged to call the office with any questions or concerns that should arise related to todays visit.    Orders Placed This Encounter  Procedures   MM 3D SCREENING MAMMOGRAM  BILATERAL BREAST   DG Bone Density   Cologuard   T3   TSH + free T4   T3 Uptake    Meds ordered this encounter  Medications   PARoxetine  (PAXIL ) 10 MG tablet    Sig: Take 1 tablet (10 mg total) by mouth daily.    Dispense:  90 tablet    Refill:  3    For future refills, keep on file. Do not fill until patient is due, I am taking over managing this medication.   thyroid  (ARMOUR THYROID ) 90 MG tablet    Sig: Take 1 tablet (90 mg total) by mouth daily.    Dispense:  90 tablet    Refill:  3    For future refills, keep on file, I am taking over managing this medication now.    Return in about 1 month (around 10/30/2023) for F/U, Review labs/test, Elmon Shader PCP.  Time spent:30 Minutes Time spent with patient included reviewing progress notes, labs, imaging studies, and discussing plan for follow up.   [place] Controlled Substance Database was reviewed by me for overdose risk score (ORS)   This patient was seen by Mardy Maxin, FNP-C in collaboration with Dr. Sigrid Bathe as a part of collaborative care agreement.   Korrie Hofbauer R. Maxin, MSN, FNP-C Internal Medicine

## 2023-10-07 ENCOUNTER — Encounter: Payer: Self-pay | Admitting: Nurse Practitioner

## 2023-10-07 LAB — COLOGUARD

## 2023-10-14 DIAGNOSIS — Z1212 Encounter for screening for malignant neoplasm of rectum: Secondary | ICD-10-CM | POA: Diagnosis not present

## 2023-10-14 DIAGNOSIS — Z1211 Encounter for screening for malignant neoplasm of colon: Secondary | ICD-10-CM | POA: Diagnosis not present

## 2023-10-23 LAB — COLOGUARD: COLOGUARD: NEGATIVE

## 2023-10-26 DIAGNOSIS — E039 Hypothyroidism, unspecified: Secondary | ICD-10-CM | POA: Diagnosis not present

## 2023-10-27 ENCOUNTER — Ambulatory Visit: Admitting: Nurse Practitioner

## 2023-10-27 LAB — T3 UPTAKE: T3 Uptake Ratio: 24 % (ref 24–39)

## 2023-10-27 LAB — TSH+FREE T4
Free T4: 1.05 ng/dL (ref 0.82–1.77)
TSH: 0.112 u[IU]/mL — ABNORMAL LOW (ref 0.450–4.500)

## 2023-10-27 LAB — T3: T3, Total: 252 ng/dL — ABNORMAL HIGH (ref 71–180)

## 2023-11-03 ENCOUNTER — Ambulatory Visit
Admission: RE | Admit: 2023-11-03 | Discharge: 2023-11-03 | Disposition: A | Discharge status: Home / Self Care | Source: Ambulatory Visit | Attending: Nurse Practitioner | Admitting: Nurse Practitioner

## 2023-11-03 ENCOUNTER — Encounter: Payer: Self-pay | Admitting: Radiology

## 2023-11-03 DIAGNOSIS — Z1231 Encounter for screening mammogram for malignant neoplasm of breast: Secondary | ICD-10-CM | POA: Diagnosis not present

## 2023-11-03 DIAGNOSIS — E2839 Other primary ovarian failure: Secondary | ICD-10-CM | POA: Diagnosis not present

## 2023-11-03 DIAGNOSIS — M8589 Other specified disorders of bone density and structure, multiple sites: Secondary | ICD-10-CM | POA: Insufficient documentation

## 2023-11-03 DIAGNOSIS — Z780 Asymptomatic menopausal state: Secondary | ICD-10-CM | POA: Diagnosis not present

## 2023-11-04 ENCOUNTER — Ambulatory Visit: Payer: Self-pay | Admitting: Nurse Practitioner

## 2023-11-04 NOTE — Progress Notes (Signed)
 Will discuss lab results at her upcoming office visit

## 2023-11-08 ENCOUNTER — Encounter: Payer: Self-pay | Admitting: Nurse Practitioner

## 2023-11-08 ENCOUNTER — Ambulatory Visit: Admitting: Nurse Practitioner

## 2023-11-08 VITALS — BP 120/80 | HR 72 | Temp 96.9°F | Resp 16 | Ht 65.0 in | Wt 178.4 lb

## 2023-11-08 DIAGNOSIS — M8589 Other specified disorders of bone density and structure, multiple sites: Secondary | ICD-10-CM

## 2023-11-08 DIAGNOSIS — E039 Hypothyroidism, unspecified: Secondary | ICD-10-CM | POA: Diagnosis not present

## 2023-11-08 DIAGNOSIS — N951 Menopausal and female climacteric states: Secondary | ICD-10-CM

## 2023-11-08 NOTE — Progress Notes (Unsigned)
 [facility] [address] [place], [place] [address]  Internal MEDICINE  Office Visit Note  Patient Name: Erica Hammond  [id number]  [id number]  Date of Service: 11/08/2023  Chief Complaint  Patient presents with  . Follow-up    Review labs    HPI Erica Hammond presents for a follow-up visit for lab results.  Low TSH and elevated total T3 Negative cologuard.  Osteopenia -- takes calcium and vitamin D Vasomotor symptoms -- taking paroxetine     Current Medication: Outpatient Encounter Medications as of 11/08/2023  Medication Sig  . PARoxetine  (PAXIL ) 10 MG tablet Take 1 tablet (10 mg total) by mouth daily.  . thyroid  (ARMOUR THYROID ) 90 MG tablet Take 1 tablet (90 mg total) by mouth daily.  . Vitamin D, Ergocalciferol, (DRISDOL) 1.25 MG (50000 UNIT) CAPS capsule Take by mouth.   No facility-administered encounter medications on file as of 11/08/2023.    Surgical History: History reviewed. No pertinent surgical history.  Medical History: Past Medical History:  Diagnosis Date  . Hx of dysplastic nevus 06/25/2009   multiple sites     Family History: Family History  Problem Relation Age of Onset  . Breast cancer Neg Hx     Social History   Socioeconomic History  . Marital status: Married    Spouse name: Not on file  . Number of children: Not on file  . Years of education: Not on file  . Highest education level: Not on file  Occupational History  . Not on file  Tobacco Use  . Smoking status: Never  . Smokeless tobacco: Never  Substance and Sexual Activity  . Alcohol use: Yes    Comment: rare  . Drug use: Never  . Sexual activity: Not Currently  Other Topics Concern  . Not on file  Social History Narrative  . Not on file   Social Drivers of Health   Financial Resource Strain: Not on file  Food Insecurity: Not on file  Transportation Needs: Not on file  Physical Activity: Not on file  Stress: Not on file  Social Connections: Not on file   Intimate Partner Violence: Not on file      Review of Systems  Constitutional:  Positive for fatigue. Negative for chills and unexpected weight change.  HENT:  Negative for congestion, postnasal drip, rhinorrhea, sneezing and sore throat.   Eyes:  Negative for redness.  Respiratory: Negative.  Negative for cough, chest tightness, shortness of breath and wheezing.   Cardiovascular: Negative.  Negative for chest pain and palpitations.  Gastrointestinal:  Negative for abdominal pain, constipation, diarrhea, nausea and vomiting.  Genitourinary:  Negative for dysuria and frequency.  Musculoskeletal: Negative.  Negative for arthralgias, back pain, joint swelling and neck pain.  Skin:  Negative for rash.  Neurological: Negative.  Negative for tremors and numbness.  Hematological:  Negative for adenopathy. Does not bruise/bleed easily.  Psychiatric/Behavioral:  Negative for behavioral problems (Depression), sleep disturbance and suicidal ideas. The patient is not nervous/anxious.     Vital Signs: BP 120/80   Pulse 72   Temp (!) 96.9 F (36.1 C)   Resp 16   Ht 5' 5 (1.651 m)   Wt 178 lb 6.4 oz (80.9 kg)   SpO2 96%   BMI 29.69 kg/m    Physical Exam Vitals reviewed.  Constitutional:      General: She is not in acute distress.    Appearance: Normal appearance. She is not ill-appearing.  HENT:     Head: Normocephalic  and atraumatic.  Eyes:     Pupils: Pupils are equal, round, and reactive to light.  Cardiovascular:     Rate and Rhythm: Normal rate and regular rhythm.  Pulmonary:     Effort: Pulmonary effort is normal. No respiratory distress.  Musculoskeletal:     Right lower leg: No edema.     Left lower leg: No edema.  Skin:    General: Skin is warm and dry.     Capillary Refill: Capillary refill takes less than 2 seconds.  Neurological:     Mental Status: She is alert and oriented to person, place, and time.  Psychiatric:        Mood and Affect: Mood normal.         Behavior: Behavior normal.        Assessment/Plan: 1. Acquired hypothyroidism (Primary) ***  2. Osteopenia of multiple sites ***  3. Vasomotor symptoms due to menopause ***   General Counseling: Erica Hammond verbalizes understanding of the findings of todays visit and agrees with plan of treatment. I have discussed any further diagnostic evaluation that may be needed or ordered today. We also reviewed her medications today. she has been encouraged to call the office with any questions or concerns that should arise related to todays visit.    No orders of the defined types were placed in this encounter.   No orders of the defined types were placed in this encounter.   Return in about 5 months (around 04/09/2024) for CPE, Erica Hammond PCP and otherwise as needed. .   Total time spent:30 Minutes Time spent includes review of chart, medications, test results, and follow up plan with the patient.   [place] Controlled Substance Database was reviewed by me.  This patient was seen by Erica Maxin, FNP-C in collaboration with Erica Hammond as a part of collaborative care agreement.   Erica Kilgallon R. Maxin, MSN, FNP-C Internal medicine

## 2023-12-18 ENCOUNTER — Encounter: Payer: Self-pay | Admitting: Nurse Practitioner

## 2024-04-03 ENCOUNTER — Encounter: Admitting: Nurse Practitioner
